# Patient Record
Sex: Male | Born: 1980 | Race: Black or African American | Hispanic: No | Marital: Married | Smoking: Never smoker
Health system: Southern US, Community
[De-identification: ages and names within clinical notes are randomized; demographics above are authoritative.]

---

## 2008-03-11 ENCOUNTER — Emergency Department (HOSPITAL_BASED_OUTPATIENT_CLINIC_OR_DEPARTMENT_OTHER): Admission: EM | Admit: 2008-03-11 | Discharge: 2008-03-11 | Payer: Self-pay | Admitting: Emergency Medicine

## 2008-06-07 ENCOUNTER — Emergency Department (HOSPITAL_BASED_OUTPATIENT_CLINIC_OR_DEPARTMENT_OTHER): Admission: EM | Admit: 2008-06-07 | Discharge: 2008-06-07 | Payer: Self-pay | Admitting: Emergency Medicine

## 2009-12-18 ENCOUNTER — Emergency Department (HOSPITAL_BASED_OUTPATIENT_CLINIC_OR_DEPARTMENT_OTHER): Admission: EM | Admit: 2009-12-18 | Discharge: 2009-12-18 | Payer: Self-pay | Admitting: Emergency Medicine

## 2010-01-09 ENCOUNTER — Ambulatory Visit: Payer: Self-pay | Admitting: Radiology

## 2010-01-09 ENCOUNTER — Emergency Department (HOSPITAL_BASED_OUTPATIENT_CLINIC_OR_DEPARTMENT_OTHER): Admission: EM | Admit: 2010-01-09 | Discharge: 2010-01-09 | Payer: Self-pay | Admitting: Emergency Medicine

## 2010-04-01 ENCOUNTER — Emergency Department (HOSPITAL_BASED_OUTPATIENT_CLINIC_OR_DEPARTMENT_OTHER)
Admission: EM | Admit: 2010-04-01 | Discharge: 2010-04-01 | Payer: Self-pay | Source: Home / Self Care | Admitting: Emergency Medicine

## 2010-05-26 LAB — GC/CHLAMYDIA PROBE AMP, GENITAL
Chlamydia, DNA Probe: NEGATIVE
GC Probe Amp, Genital: NEGATIVE

## 2010-05-26 LAB — URINALYSIS, ROUTINE W REFLEX MICROSCOPIC
Bilirubin Urine: NEGATIVE
Glucose, UA: NEGATIVE mg/dL
Hgb urine dipstick: NEGATIVE
Ketones, ur: NEGATIVE mg/dL
Protein, ur: NEGATIVE mg/dL

## 2010-05-26 LAB — URINE MICROSCOPIC-ADD ON

## 2011-07-05 ENCOUNTER — Emergency Department (HOSPITAL_BASED_OUTPATIENT_CLINIC_OR_DEPARTMENT_OTHER)
Admission: EM | Admit: 2011-07-05 | Discharge: 2011-07-05 | Disposition: A | Discharge status: Home / Self Care | Payer: Managed Care, Other (non HMO) | Attending: Emergency Medicine | Admitting: Emergency Medicine

## 2011-07-05 ENCOUNTER — Encounter (HOSPITAL_BASED_OUTPATIENT_CLINIC_OR_DEPARTMENT_OTHER): Payer: Self-pay | Admitting: Emergency Medicine

## 2011-07-05 DIAGNOSIS — M549 Dorsalgia, unspecified: Secondary | ICD-10-CM

## 2011-07-05 MED ORDER — HYDROCODONE-ACETAMINOPHEN 5-325 MG PO TABS: 2.0000 | ORAL_TABLET | ORAL | Status: AC | PRN

## 2011-07-05 MED ORDER — CYCLOBENZAPRINE HCL 10 MG PO TABS: 10.0000 mg | ORAL_TABLET | Freq: Two times a day (BID) | ORAL | Status: AC | PRN

## 2011-07-05 MED ORDER — PREDNISONE 10 MG PO TABS: ORAL_TABLET | ORAL | Status: DC

## 2011-07-05 NOTE — Discharge Instructions (Signed)
Back Pain, Adult Low back pain is very common. About 1 in 5 people have back pain.The cause of low back pain is rarely dangerous. The pain often gets better over time.About half of people with a sudden onset of back pain feel better in just 2 weeks. About 8 in 10 people feel better by 6 weeks.  CAUSES Some common causes of back pain include:  Strain of the muscles or ligaments supporting the spine.   Wear and tear (degeneration) of the spinal discs.   Arthritis.   Direct injury to the back.  DIAGNOSIS Most of the time, the direct cause of low back pain is not known.However, back pain can be treated effectively even when the exact cause of the pain is unknown.Answering your caregiver's questions about your overall health and symptoms is one of the most accurate ways to make sure the cause of your pain is not dangerous. If your caregiver needs more information, he or she may order lab work or imaging tests (X-rays or MRIs).However, even if imaging tests show changes in your back, this usually does not require surgery. HOME CARE INSTRUCTIONS For many people, back pain returns.Since low back pain is rarely dangerous, it is often a condition that people can learn to manageon their own.   Remain active. It is stressful on the back to sit or stand in one place. Do not sit, drive, or stand in one place for more than 30 minutes at a time. Take short walks on level surfaces as soon as pain allows.Try to increase the length of time you walk each day.   Do not stay in bed.Resting more than 1 or 2 days can delay your recovery.   Do not avoid exercise or work.Your body is made to move.It is not dangerous to be active, even though your back may hurt.Your back will likely heal faster if you return to being active before your pain is gone.   Pay attention to your body when you bend and lift. Many people have less discomfortwhen lifting if they bend their knees, keep the load close to their  bodies,and avoid twisting. Often, the most comfortable positions are those that put less stress on your recovering back.   Find a comfortable position to sleep. Use a firm mattress and lie on your side with your knees slightly bent. If you lie on your back, put a pillow under your knees.   Only take over-the-counter or prescription medicines as directed by your caregiver. Over-the-counter medicines to reduce pain and inflammation are often the most helpful.Your caregiver may prescribe muscle relaxant drugs.These medicines help dull your pain so you can more quickly return to your normal activities and healthy exercise.   Put ice on the injured area.   Put ice in a plastic bag.   Place a towel between your skin and the bag.   Leave the ice on for 15 to 20 minutes, 3 to 4 times a day for the first 2 to 3 days. After that, ice and heat may be alternated to reduce pain and spasms.   Ask your caregiver about trying back exercises and gentle massage. This may be of some benefit.   Avoid feeling anxious or stressed.Stress increases muscle tension and can worsen back pain.It is important to recognize when you are anxious or stressed and learn ways to manage it.Exercise is a great option.  SEEK MEDICAL CARE IF:  You have pain that is not relieved with rest or medicine.   You have   pain that does not improve in 1 week.   You have new symptoms.   You are generally not feeling well.  SEEK IMMEDIATE MEDICAL CARE IF:   You have pain that radiates from your back into your legs.   You develop new bowel or bladder control problems.   You have unusual weakness or numbness in your arms or legs.   You develop nausea or vomiting.   You develop abdominal pain.   You feel faint.  Document Released: 02/27/2005 Document Revised: 02/16/2011 Document Reviewed: 07/18/2010 ExitCare Patient Information 2012 [employer].    Back Exercises Back exercises help treat and prevent back injuries.  The goal of back exercises is to increase the strength of your abdominal and back muscles and the flexibility of your back. These exercises should be started when you no longer have back pain. Back exercises include:  Pelvic Tilt. Lie on your back with your knees bent. Tilt your pelvis until the lower part of your back is against the floor. Hold this position 5 to 10 sec and repeat 5 to 10 times.   Knee to Chest. Pull first 1 knee up against your chest and hold for 20 to 30 seconds, repeat this with the other knee, and then both knees. This may be done with the other leg straight or bent, whichever feels better.   Sit-Ups or Curl-Ups. Bend your knees 90 degrees. Start with tilting your pelvis, and do a partial, slow sit-up, lifting your trunk only 30 to 45 degrees off the floor. Take at least 2 to 3 seconds for each sit-up. Do not do sit-ups with your knees out straight. If partial sit-ups are difficult, simply do the above but with only tightening your abdominal muscles and holding it as directed.   Hip-Lift. Lie on your back with your knees flexed 90 degrees. Push down with your feet and shoulders as you raise your hips a couple inches off the floor; hold for 10 seconds, repeat 5 to 10 times.   Back arches. Lie on your stomach, propping yourself up on bent elbows. Slowly press on your hands, causing an arch in your low back. Repeat 3 to 5 times. Any initial stiffness and discomfort should lessen with repetition over time.   Shoulder-Lifts. Lie face down with arms beside your body. Keep hips and torso pressed to floor as you slowly lift your head and shoulders off the floor.  Do not overdo your exercises, especially in the beginning. Exercises may cause you some mild back discomfort which lasts for a few minutes; however, if the pain is more severe, or lasts for more than 15 minutes, do not continue exercises until you see your caregiver. Improvement with exercise therapy for back problems is slow.    See your caregivers for assistance with developing a proper back exercise program. Document Released: 04/06/2004 Document Revised: 02/16/2011 Document Reviewed: 02/27/2005 ExitCare Patient Information 2012 [employer]. 

## 2011-07-05 NOTE — ED Notes (Signed)
Pt c/o lower back pain since sun no known injury

## 2011-07-05 NOTE — ED Provider Notes (Signed)
History     CSN: [id number]  Arrival date & time 07/05/11  2005   None     Chief Complaint  Patient presents with  . Back Pain    (Consider location/radiation/quality/duration/timing/severity/associated sxs/prior treatment) Patient is a 31 y.o. male presenting with back pain. The history is provided by the patient. No language interpreter was used.  Back Pain  This is a new problem. The current episode started more than 1 week ago. The problem occurs constantly. The problem has not changed since onset.The pain is associated with no known injury. The pain is present in the lumbar spine. The quality of the pain is described as aching. The pain does not radiate. The pain is at a severity of 6/10. The pain is moderate. The pain is the same all the time. Stiffness is present all day. He has tried NSAIDs for the symptoms. The treatment provided no relief.  Pt lifts for a living.  Pt does not recall any injury  History reviewed. No pertinent past medical history.  History reviewed. No pertinent past surgical history.  No family history on file.  History  Substance Use Topics  . Smoking status: Never Smoker   . Smokeless tobacco: Not on file  . Alcohol Use: Yes      Review of Systems  Musculoskeletal: Positive for back pain.  All other systems reviewed and are negative.    Allergies  Review of patient's allergies indicates no known allergies.  Home Medications   Current Outpatient Rx  Name Route Sig Dispense Refill  . BC HEADACHE POWDER PO Oral Take 1 packet by mouth daily as needed. Patient used this medication for headache.    . IBUPROFEN 200 MG PO TABS Oral Take 800 mg by mouth every 6 (six) hours as needed.      BP 110/73  Pulse 69  Temp(Src) 98.5 F (36.9 C) (Oral)  Resp 18  Ht 5\' 8"  (1.727 m)  Wt 151 lb (68.493 kg)  BMI 22.96 kg/m2  SpO2 99%  Physical Exam  Nursing note and vitals reviewed. Constitutional: He appears well-developed and well-nourished.    HENT:  Head: Normocephalic.  Right Ear: External ear normal.  Left Ear: External ear normal.  Eyes: Conjunctivae and EOM are normal. Pupils are equal, round, and reactive to light.  Neck: Normal range of motion. Neck supple.  Cardiovascular: Normal rate.   Pulmonary/Chest: Effort normal and breath sounds normal.  Abdominal: Soft.  Musculoskeletal: He exhibits tenderness.       Tender ls spine, from,  nv and ns intact  Neurological: He is alert.  Skin: Skin is warm.  Psychiatric: He has a normal mood and affect.    ED Course  Procedures (including critical care time)  Labs Reviewed - No data to display No results found.   No diagnosis found.    MDM  Pt placed on prednisone, flexeril and hydrocodone.  i advised pt he needs to recheck with Dr. Carolyne Fiscal on Monday        Merion Grimaldo [place], [place] 07/05/11 2238

## 2011-07-06 NOTE — ED Provider Notes (Signed)
Medical screening examination/treatment/procedure(s) were performed by non-physician practitioner and as supervising physician I was immediately available for consultation/collaboration.   Carleene Cooper III, MD 07/06/11 [phone]

## 2012-02-28 IMAGING — CR DG FINGER RING 2+V*R*
3 series · 3 of 3 positions shown · non-contrast
Comparison: None

CLINICAL DATA: Ring finger injury and pain.

RIGHT RING FINGER 2+V

[x finger pa right]
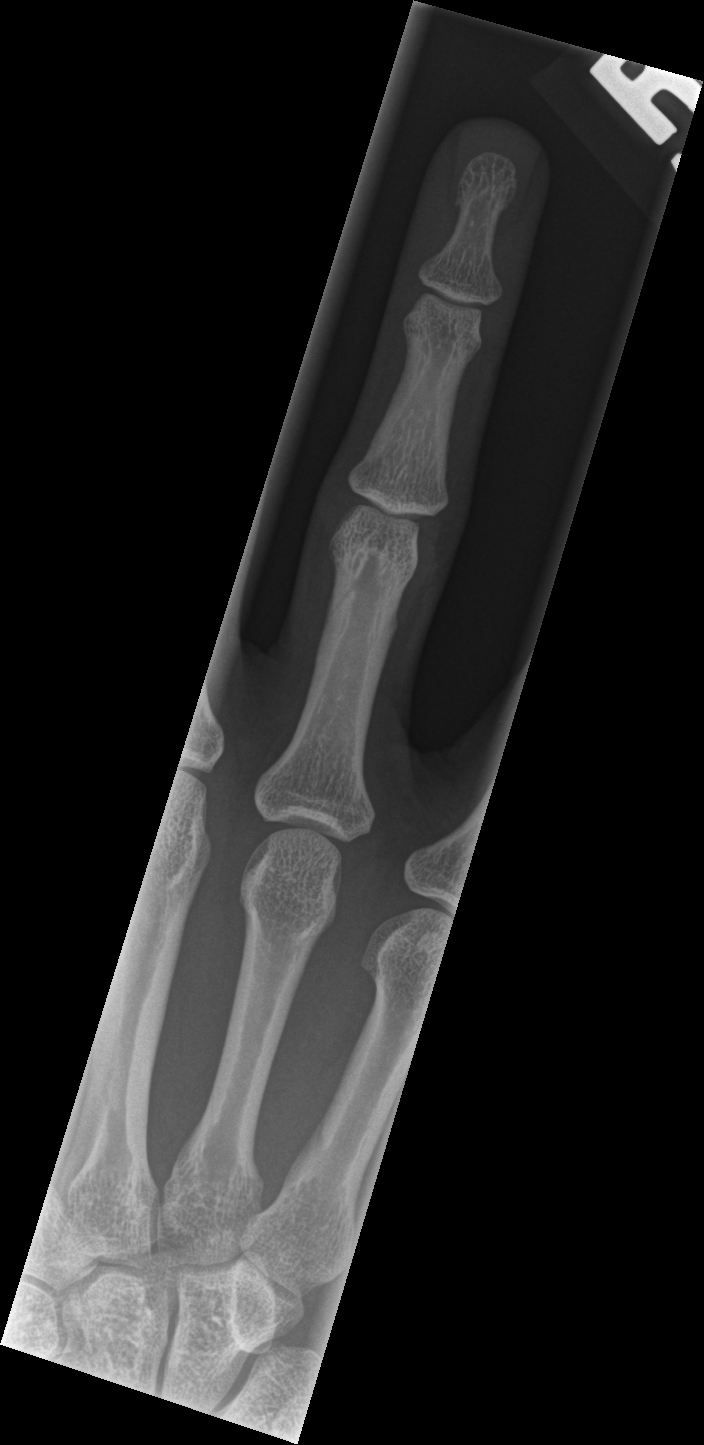

[x finger obl. right]
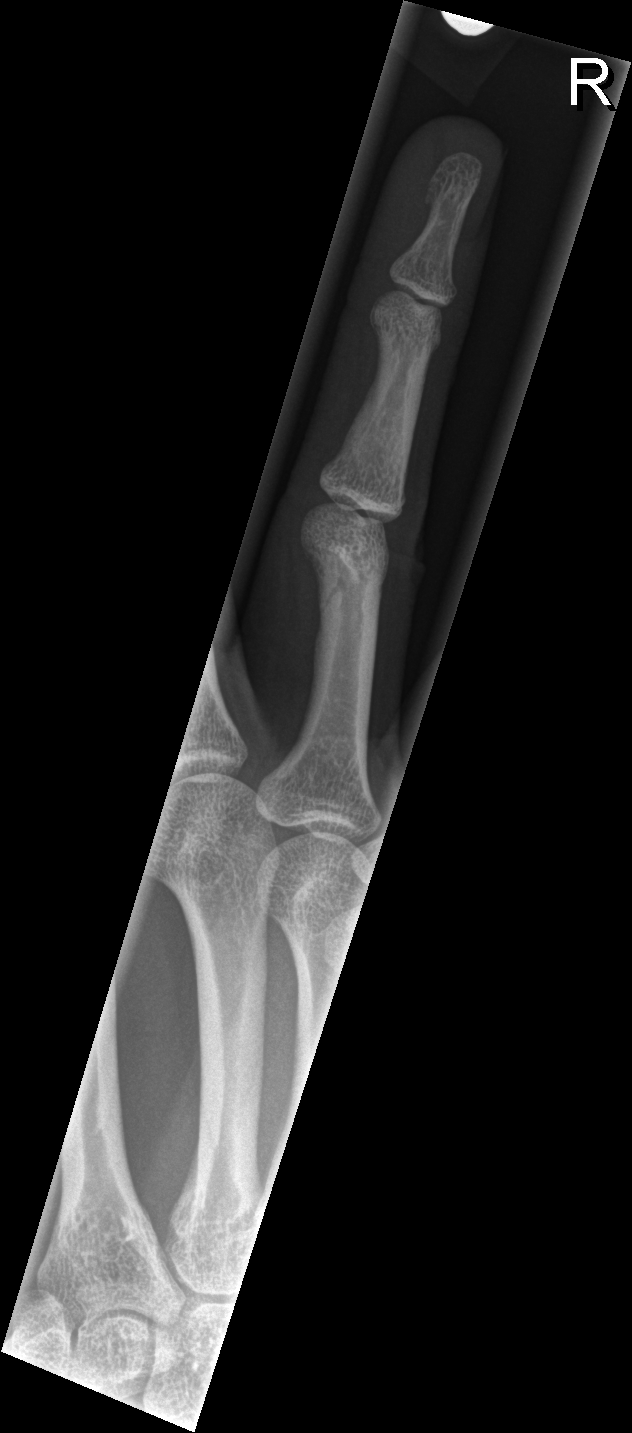

[x finger lateral right]
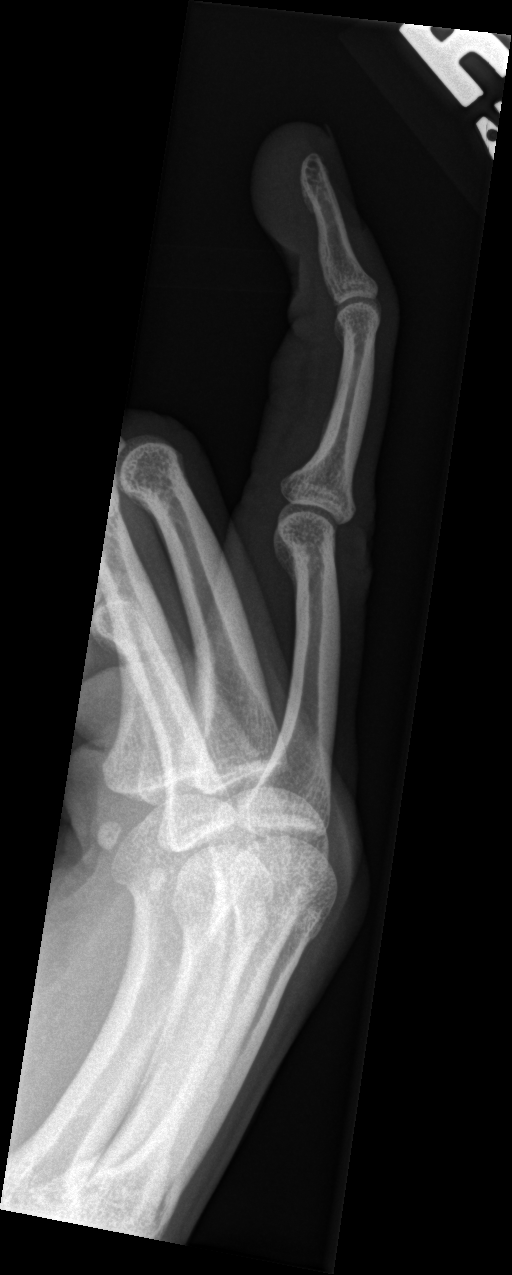

[3 of 3 positions shown; findings below may reference images not displayed]

FINDINGS: No evidence of acute fracture, subluxation or dislocation
identified.

No radio-opaque foreign bodies are present.

No focal bony lesions are noted.

The joint spaces are unremarkable.
IMPRESSION: No acute bony abnormality.

## 2013-05-05 ENCOUNTER — Emergency Department (HOSPITAL_BASED_OUTPATIENT_CLINIC_OR_DEPARTMENT_OTHER)
Admission: EM | Admit: 2013-05-05 | Discharge: 2013-05-06 | Disposition: A | Discharge status: Home / Self Care | Payer: Managed Care, Other (non HMO) | Attending: Emergency Medicine | Admitting: Emergency Medicine

## 2013-05-05 ENCOUNTER — Encounter (HOSPITAL_BASED_OUTPATIENT_CLINIC_OR_DEPARTMENT_OTHER): Payer: Self-pay | Admitting: Emergency Medicine

## 2013-05-05 DIAGNOSIS — K644 Residual hemorrhoidal skin tags: Secondary | ICD-10-CM | POA: Insufficient documentation

## 2013-05-05 DIAGNOSIS — K649 Unspecified hemorrhoids: Secondary | ICD-10-CM

## 2013-05-05 MED ORDER — TRAMADOL HCL 50 MG PO TABS: 50.0000 mg | ORAL_TABLET | Freq: Four times a day (QID) | ORAL | Status: DC | PRN

## 2013-05-05 MED ORDER — DOCUSATE SODIUM 100 MG PO CAPS: 100.0000 mg | ORAL_CAPSULE | Freq: Two times a day (BID) | ORAL | Status: AC

## 2013-05-05 MED ORDER — HYDROCORTISONE ACETATE 25 MG RE SUPP: 25.0000 mg | Freq: Two times a day (BID) | RECTAL | Status: AC

## 2013-05-05 NOTE — ED Provider Notes (Signed)
CSN: [id number]     Arrival date & time 05/05/13  2106 History   This chart was scribed for Deosha Werden Smitty CordsK Shareef Eddinger-Rasch, MD by Donne Anonayla Curran, ED Scribe. This patient was seen in room MH04/MH04 and the patient's care was started at 2327.  First MD Initiated Contact with Patient 05/05/13 2327     Chief Complaint  Patient presents with  . Abscess     Patient is a 33 y.o. male presenting with abscess. The history is provided by the patient. No language interpreter was used.  Abscess Location:  Ano-genital Ano-genital abscess location:  Anus Abscess quality: painful   Abscess quality: not draining and no itching   Red streaking: no   Duration:  2 days Progression:  Worsening Pain details:    Quality:  Unable to specify   Severity:  Moderate   Timing:  Constant   Progression:  Unchanged Chronicity:  New Context: not diabetes   Relieved by:  None tried Worsened by:  Nothing tried Ineffective treatments:  None tried Associated symptoms: no fever, no nausea and no vomiting   Risk factors: no prior abscess     History reviewed. No pertinent past medical history. History reviewed. No pertinent past surgical history. No family history on file. History  Substance Use Topics  . Smoking status: Never Smoker   . Smokeless tobacco: Not on file  . Alcohol Use: Yes    Review of Systems  Constitutional: Negative for fever.  Gastrointestinal: Negative for nausea and vomiting.  All other systems reviewed and are negative.      Allergies  Review of patient's allergies indicates no known allergies.  Home Medications   Current Outpatient Rx  Name  Route  Sig  Dispense  Refill  . Aspirin-Salicylamide-Caffeine (BC HEADACHE POWDER PO)   Oral   Take 1 packet by mouth daily as needed. Patient used this medication for headache.         . ibuprofen (ADVIL,MOTRIN) 200 MG tablet   Oral   Take 800 mg by mouth every 6 (six) hours as needed.         . predniSONE (DELTASONE) 10 MG tablet     6,5,4,3,2,1 taper   21 tablet   0    BP 122/68  Pulse 72  Temp(Src) 98.6 F (37 C) (Oral)  Resp 18  Ht 5\' 8"  (1.727 m)  Wt 155 lb (70.308 kg)  BMI 23.57 kg/m2  SpO2 100%  Physical Exam  Nursing note and vitals reviewed. Constitutional: He appears well-developed and well-nourished. No distress.  HENT:  Head: Normocephalic and atraumatic.  Mouth/Throat: Oropharynx is clear and moist. No oropharyngeal exudate.  Trachea midline  Eyes: Conjunctivae are normal. Pupils are equal, round, and reactive to light.  Neck: Normal range of motion. Neck supple. No tracheal deviation present.  Cardiovascular: Normal rate, regular rhythm and normal heart sounds.  Exam reveals no gallop and no friction rub.   No murmur heard. Pulmonary/Chest: Effort normal and breath sounds normal. No respiratory distress. He has no wheezes. He has no rales.  Abdominal: Soft. Bowel sounds are normal. He exhibits no distension. There is no tenderness. There is no rebound.  Genitourinary: Rectal exam shows external hemorrhoid.  Chaperone WentworthMorgan, NT present. Hemorrhoid at 1 o'clock position. Not thrombosed  Musculoskeletal: Normal range of motion.  Neurological: He is alert. He has normal reflexes.  Skin: Skin is warm and dry.  Psychiatric: He has a normal mood and affect. His behavior is normal.    ED Course  Procedures (including critical care time) DIAGNOSTIC STUDIES: Oxygen Saturation is 100% on RA, normal by my interpretation.    COORDINATION OF CARE: 11:36 PM Discussed treatment plan which includes hydrocortisone suppository and tramadol with pt at bedside and pt agreed to plan.    Labs Review Labs Reviewed - No data to display Imaging Review No results found.  EKG Interpretation   None       MDM   Final diagnoses:  None    Hemorrhoid, non thrombosed.  Will treat with proctosol and colace in order to loosen stool.    I personally performed the services described in this  documentation, which was scribed in my presence. The recorded information has been reviewed and is accurate.     Jasmine Awe, MD 05/06/13 [phone]

## 2013-05-05 NOTE — Discharge Instructions (Signed)
Fiber Content in Foods Drinking plenty of fluids and consuming foods high in fiber can help with constipation. See the list below for the fiber content of some common foods. Starches and Grains / Dietary Fiber (g)  Cheerios, 1 cup / 3 g  Kellogg's Corn Flakes, 1 cup / 0.7 g  Rice Krispies, 1  cup / 0.3 g  Quaker Oat Life Cereal,  cup / 2.1 g  Oatmeal, instant (cooked),  cup / 2 g  Kellogg's Frosted Mini Wheats, 1 cup / 5.1 g  Rice, brown, long-grain (cooked), 1 cup / 3.5 g  Rice, white, long-grain (cooked), 1 cup / 0.6 g  Macaroni, cooked, enriched, 1 cup / 2.5 g Legumes / Dietary Fiber (g)  Beans, baked, canned, plain or vegetarian,  cup / 5.2 g  Beans, kidney, canned,  cup / 6.8 g  Beans, pinto, dried (cooked),  cup / 7.7 g  Beans, pinto, canned,  cup / 5.5 g Breads and Crackers / Dietary Fiber (g)  Graham crackers, plain or honey, 2 squares / 0.7 g  Saltine crackers, 3 squares / 0.3 g  Pretzels, plain, salted, 10 pieces / 1.8 g  Bread, whole-wheat, 1 slice / 1.9 g  Bread, white, 1 slice / 0.7 g  Bread, raisin, 1 slice / 1.2 g  Bagel, plain, 3 oz / 2 g  Tortilla, flour, 1 oz / 0.9 g  Tortilla, corn, 1 small / 1.5 g  Bun, hamburger or hotdog, 1 small / 0.9 g Fruits / Dietary Fiber (g)  Apple, raw with skin, 1 medium / 4.4 g  Applesauce, sweetened,  cup / 1.5 g  Banana,  medium / 1.5 g  Grapes, 10 grapes / 0.4 g  Orange, 1 small / 2.3 g  Raisin, 1.5 oz / 1.6 g  Melon, 1 cup / 1.4 g Vegetables / Dietary Fiber (g)  Green beans, canned,  cup / 1.3 g  Carrots (cooked),  cup / 2.3 g  Broccoli (cooked),  cup / 2.8 g  Peas, frozen (cooked),  cup / 4.4 g  Potatoes, mashed,  cup / 1.6 g  Lettuce, 1 cup / 0.5 g  Corn, canned,  cup / 1.6 g  Tomato,  cup / 1.1 g Document Released: 07/16/2006 Document Revised: 05/22/2011 Document Reviewed: 09/10/2006 ExitCare Patient Information 2014 [employer] [place].  Hemorrhoids Hemorrhoids  are swollen veins around the rectum or anus. There are two types of hemorrhoids:   Internal hemorrhoids. These occur in the veins just inside the rectum. They may poke through to the outside and become irritated and painful.  External hemorrhoids. These occur in the veins outside the anus and can be felt as a painful swelling or hard lump near the anus. CAUSES  Pregnancy.   Obesity.   Constipation or diarrhea.   Straining to have a bowel movement.   Sitting for long periods on the toilet.  Heavy lifting or other activity that caused you to strain.  Anal intercourse. SYMPTOMS   Pain.   Anal itching or irritation.   Rectal bleeding.   Fecal leakage.   Anal swelling.   One or more lumps around the anus.  DIAGNOSIS  Your caregiver may be able to diagnose hemorrhoids by visual examination. Other examinations or tests that may be performed include:   Examination of the rectal area with a gloved hand (digital rectal exam).   Examination of anal canal using a small tube (scope).   A blood test if you have lost a significant  amount of blood. °· A test to look inside the colon (sigmoidoscopy or colonoscopy). °TREATMENT °Most hemorrhoids can be treated at home. However, if symptoms do not seem to be getting better or if you have a lot of rectal bleeding, your caregiver may perform a procedure to help make the hemorrhoids get smaller or remove them completely. Possible treatments include:  °· Placing a rubber band at the base of the hemorrhoid to cut off the circulation (rubber band ligation).   °· Injecting a chemical to shrink the hemorrhoid (sclerotherapy).   °· Using a tool to burn the hemorrhoid (infrared light therapy).   °· Surgically removing the hemorrhoid (hemorrhoidectomy).   °· Stapling the hemorrhoid to block blood flow to the tissue (hemorrhoid stapling).   °HOME CARE INSTRUCTIONS  °· Eat foods with fiber, such as whole grains, beans, nuts, fruits, and  vegetables. Ask your doctor about taking products with added fiber in them (fiber supplements). °· Increase fluid intake. Drink enough water and fluids to keep your urine clear or pale yellow.   °· Exercise regularly.   °· Go to the bathroom when you have the urge to have a bowel movement. Do not wait.   °· Avoid straining to have bowel movements.   °· Keep the anal area dry and clean. Use wet toilet paper or moist towelettes after a bowel movement.   °· Medicated creams and suppositories may be used or applied as directed.   °· Only take over-the-counter or prescription medicines as directed by your caregiver.   °· Take warm sitz baths for 15 20 minutes, 3 4 times a day to ease pain and discomfort.   °· Place ice packs on the hemorrhoids if they are tender and swollen. Using ice packs between sitz baths may be helpful.   °· Put ice in a plastic bag.   °· Place a towel between your skin and the bag.   °· Leave the ice on for 15 20 minutes, 3 4 times a day.   °· Do not use a donut-shaped pillow or sit on the toilet for long periods. This increases blood pooling and pain.   °SEEK MEDICAL CARE IF: °· You have increasing pain and swelling that is not controlled by treatment or medicine. °· You have uncontrolled bleeding. °· You have difficulty or you are unable to have a bowel movement. °· You have pain or inflammation outside the area of the hemorrhoids. °MAKE SURE YOU: °· Understand these instructions. °· Will watch your condition. °· Will get help right away if you are not doing well or get worse. °Document Released: 02/25/2000 Document Revised: 02/14/2012 Document Reviewed: 01/02/2012 °ExitCare® Patient Information ©2014 [employer]. ° °

## 2013-05-05 NOTE — ED Notes (Signed)
Pt c/o abscess to rectum, no drainage noted

## 2022-11-28 ENCOUNTER — Other Ambulatory Visit: Payer: Self-pay

## 2022-11-28 ENCOUNTER — Emergency Department (HOSPITAL_BASED_OUTPATIENT_CLINIC_OR_DEPARTMENT_OTHER)
Admission: EM | Admit: 2022-11-28 | Discharge: 2022-11-29 | Disposition: A | Discharge status: Home / Self Care | Payer: Managed Care, Other (non HMO) | Attending: Emergency Medicine | Admitting: Emergency Medicine

## 2022-11-28 ENCOUNTER — Encounter (HOSPITAL_BASED_OUTPATIENT_CLINIC_OR_DEPARTMENT_OTHER): Payer: Self-pay

## 2022-11-28 DIAGNOSIS — Y990 Civilian activity done for income or pay: Secondary | ICD-10-CM | POA: Diagnosis not present

## 2022-11-28 DIAGNOSIS — M545 Low back pain, unspecified: Secondary | ICD-10-CM | POA: Diagnosis present

## 2022-11-28 MED ORDER — METHOCARBAMOL 500 MG PO TABS
1000.0000 mg | ORAL_TABLET | Freq: Once | ORAL | Status: AC
  Administered 2022-11-29: 1000 mg via ORAL
  Filled 2022-11-28: qty 2

## 2022-11-28 MED ORDER — KETOROLAC TROMETHAMINE 15 MG/ML IJ SOLN
30.0000 mg | Freq: Once | INTRAMUSCULAR | Status: AC
  Administered 2022-11-29: 30 mg via INTRAMUSCULAR
  Filled 2022-11-28: qty 2

## 2022-11-28 NOTE — ED Triage Notes (Signed)
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 Pain persists.

## 2022-11-29 ENCOUNTER — Emergency Department (HOSPITAL_BASED_OUTPATIENT_CLINIC_OR_DEPARTMENT_OTHER): Payer: Managed Care, Other (non HMO)

## 2022-11-29 MED ORDER — METHOCARBAMOL 500 MG PO TABS: 500.0000 mg | ORAL_TABLET | Freq: Three times a day (TID) | ORAL | 0 refills | Status: AC | PRN

## 2022-11-29 MED ORDER — NAPROXEN 500 MG PO TABS: 500.0000 mg | ORAL_TABLET | Freq: Two times a day (BID) | ORAL | 0 refills | Status: AC

## 2022-11-29 MED ORDER — LIDOCAINE 5 % EX PTCH: 1.0000 | MEDICATED_PATCH | CUTANEOUS | 0 refills | Status: AC

## 2022-11-29 NOTE — Discharge Instructions (Signed)
You were evaluated in the Emergency Department and after careful evaluation, we did not find any emergent condition requiring admission or further testing in the hospital.  Your exam/testing today is overall reassuring.  Symptoms likely due to muscle strain or spasm or possibly a herniated disc.  Your x-ray was reassuring.  Given the duration of your pain we recommend follow-up with the spine experts.  Call to schedule an appointment.  Recommend using the Naprosyn twice daily as prescribed for pain.  If taking this medicine would avoid the Voltaren at home.  Can use the Robaxin muscle relaxer for more significant pain, best used at night if you are having trouble sleeping as it can cause drowsiness.  Can also use the Lidoderm numbing patches as needed daily.  Please return to the Emergency Department if you experience any worsening of your condition.   Thank you for allowing [place] to be a part of your care.

## 2022-11-29 NOTE — ED Provider Notes (Signed)
[facility] Emergency Department Provider Note MRN:  [id number]  Arrival date & time: 11/29/22     Chief Complaint   Back Pain   History of Present Illness   William Davies is a 42 y.o. year-old male with no pertinent past medical history presenting to the ED with chief complaint of back pain.  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  Did not fall, did not sustain any trauma.  Has had intermittent back pain for a long time, similar to prior episodes.  Had some spasms this evening while letting the dog out, fairly significant amount of pain.  Here for evaluation.  No numbness or weakness to the arms or legs, no bowel or bladder dysfunction.  Review of Systems  A thorough review of systems was obtained and all systems are negative except as noted in the HPI and PMH.   Patient's Health History   History reviewed. No pertinent past medical history.  History reviewed. No pertinent surgical history.  History reviewed. No pertinent family history.  Social History   Socioeconomic History   Marital status: Married    Spouse name: Not on file   Number of children: Not on file   Years of education: Not on file   Highest education level: Not on file  Occupational History   Not on file  Tobacco Use   Smoking status: Never   Smokeless tobacco: Not on file  Substance and Sexual Activity   Alcohol use: Yes   Drug use: No   Sexual activity: Not on file  Other Topics Concern   Not on file  Social History Narrative   Not on file   Social Determinants of Health   Financial Resource Strain: Low Risk  (06/27/2021)   Received from [facility], [facility] visits prior to 05/13/2022.   Overall Financial Resource Strain (CARDIA)    Difficulty of Paying Living Expenses: Not hard at all  Food Insecurity: High Risk (06/24/2022)   Received from [facility]   Hunger Vital Sign    Worried About Running Out of Food in  the Last Year: Often true    Ran Out of Food in the Last Year: Often true  Transportation Needs: Not on file (06/24/2022)  Physical Activity: Sufficiently Active (06/27/2021)   Received from [facility], [facility] visits prior to 05/13/2022.   Exercise Vital Sign    Days of Exercise per Week: 7 days    Minutes of Exercise per Session: 60 min  Stress: No Stress Concern Present (06/27/2021)   Received from [facility], [facility] visits prior to 05/13/2022.   [employer] of Occupational Health - Occupational Stress Questionnaire    Feeling of Stress : Not at all  Social Connections: Unknown (06/27/2021)   Received from [facility], [facility] visits prior to 05/13/2022.   Social Connection and Isolation Panel [NHANES]    Frequency of Communication with Friends and Family: More than three times a week    Frequency of Social Gatherings with Friends and Family: Once a week    Attends Religious Services: Patient declined    Active Member of Clubs or Organizations: Patient declined    Attends Banker Meetings: Patient declined    Marital Status: Married  Catering manager Violence: Not At Risk (06/27/2021)   Received from [facility] visits prior to 05/13/2022.   Humiliation,  Afraid, Rape, and Kick questionnaire    Fear of Current or Ex-Partner: No    Emotionally Abused: No    Physically Abused: No    Sexually Abused: No     Physical Exam   Vitals:   11/28/22 2338  BP: 112/81  Pulse: 72  Resp: 20  Temp: 97.7 F (36.5 C)  SpO2: 100%    CONSTITUTIONAL: Well-appearing, NAD NEURO/PSYCH:  Alert and oriented x 3, normal and symmetric strength and sensation, normal coordination, normal speech EYES:  eyes equal and reactive ENT/NECK:  no LAD, no JVD CARDIO: Regular rate, well-perfused, normal S1 and S2 PULM:  CTAB no wheezing or rhonchi GI/GU:  non-distended, non-tender MSK/SPINE:   No gross deformities, no edema SKIN:  no rash, atraumatic   *Additional and/or pertinent findings included in MDM below  Diagnostic and Interventional Summary    EKG Interpretation Date/Time:    Ventricular Rate:    PR Interval:    QRS Duration:    QT Interval:    QTC Calculation:   R Axis:      Text Interpretation:         Labs Reviewed - No data to display  DG Lumbar Spine Complete  Final Result      Medications  ketorolac (TORADOL) 15 MG/ML injection 30 mg (30 mg Intramuscular Given 11/29/22 0016)  methocarbamol (ROBAXIN) tablet 1,000 mg (1,000 mg Oral Given 11/29/22 0015)     Procedures  /  Critical Care Procedures  ED Course and Medical Decision Making  Initial Impression and Ddx Suspect muscle strain or spasm versus herniated disc.  Seems to be an acute exacerbation of a chronic issue.  Has been having similar back pain on and off for years.  Has never had any imaging of his back.  Has also lost some weight recently unintentionally.  Will obtain screening x-ray to ensure no obvious bony neoplasm.  X-ray will also help evaluate for arthritis or degenerative disc disease contributing to the pain.  No signs or symptoms of myelopathy, no trauma, no indication for advanced imaging at this time.  Past medical/surgical history that increases complexity of ED encounter: None  Interpretation of Diagnostics I personally reviewed the lumbar x-ray and my interpretation is as follows: No fracture, no evidence of bony neoplasm, no signs of DDD.    Patient Reassessment and Ultimate Disposition/Management     Patient is feeling better, appropriate for discharge.  Patient management required discussion with the following services or consulting groups:  None  Complexity of Problems Addressed Acute illness or injury that poses threat of life of bodily function  Additional Data Reviewed and Analyzed Further history obtained from: Further history from spouse/family  member  Additional Factors Impacting ED Encounter Risk Prescriptions  Elmer Sow. Pilar Plate, MD [facility] Emergency Medicine [facility] [email or web address]  Final Clinical Impressions(s) / ED Diagnoses     ICD-10-CM   1. Acute midline low back pain without sciatica  M54.50       ED Discharge Orders          Ordered    naproxen (NAPROSYN) 500 MG tablet  2 times daily        11/29/22 0049    methocarbamol (ROBAXIN) 500 MG tablet  Every 8 hours PRN        11/29/22 0049    lidocaine (LIDODERM) 5 %  Every 24 hours        11/29/22 0049  Discharge Instructions Discussed with and Provided to Patient:     Discharge Instructions      You were evaluated in the Emergency Department and after careful evaluation, we did not find any emergent condition requiring admission or further testing in the hospital.  Your exam/testing today is overall reassuring.  Symptoms likely due to muscle strain or spasm or possibly a herniated disc.  Your x-ray was reassuring.  Given the duration of your pain we recommend follow-up with the spine experts.  Call to schedule an appointment.  Recommend using the Naprosyn twice daily as prescribed for pain.  If taking this medicine would avoid the Voltaren at home.  Can use the Robaxin muscle relaxer for more significant pain, best used at night if you are having trouble sleeping as it can cause drowsiness.  Can also use the Lidoderm numbing patches as needed daily.  Please return to the Emergency Department if you experience any worsening of your condition.   Thank you for allowing [place] to be a part of your care.       Sabas Sous, MD 11/29/22 [phone]
